# Patient Record
Sex: Male | Born: 1970 | Race: Black or African American | Hispanic: No | Marital: Single
Health system: Southern US, Community
[De-identification: ages and names within clinical notes are randomized; demographics above are authoritative.]

---

## 2003-07-01 ENCOUNTER — Emergency Department (HOSPITAL_COMMUNITY): Admission: EM | Admit: 2003-07-01 | Discharge: 2003-07-02 | Payer: Self-pay | Admitting: Emergency Medicine

## 2010-06-02 ENCOUNTER — Emergency Department (HOSPITAL_COMMUNITY)
Admission: EM | Admit: 2010-06-02 | Discharge: 2010-06-03 | Payer: Self-pay | Source: Home / Self Care | Admitting: Emergency Medicine

## 2010-08-30 LAB — CBC
HCT: 40.5 % (ref 39.0–52.0)
Platelets: 233 10*3/uL (ref 150–400)
RBC: 4.79 MIL/uL (ref 4.22–5.81)
RDW: 12.9 % (ref 11.5–15.5)
WBC: 9.8 10*3/uL (ref 4.0–10.5)

## 2010-08-30 LAB — COMPREHENSIVE METABOLIC PANEL
AST: 21 U/L (ref 0–37)
Albumin: 4.3 g/dL (ref 3.5–5.2)
BUN: 8 mg/dL (ref 6–23)
Creatinine, Ser: 1.13 mg/dL (ref 0.4–1.5)
GFR calc Af Amer: >60 mL/min (ref >60)
Potassium: 4 mEq/L (ref 3.5–5.1)
Total Protein: 7.3 g/dL (ref 6.0–8.3)

## 2010-08-30 LAB — DIFFERENTIAL
Lymphocytes Relative: 16 % (ref 12–46)
Monocytes Absolute: 0.7 10*3/uL (ref 0.1–1.0)
Monocytes Relative: 8 % (ref 3–12)
Neutro Abs: 7.4 10*3/uL (ref 1.7–7.7)

## 2010-08-30 LAB — CK TOTAL AND CKMB (NOT AT ARMC)
CK, MB: 1.1 ng/mL (ref 0.3–4.0)
Relative Index: 0.9 (ref 0.0–2.5)

## 2010-08-30 LAB — BRAIN NATRIURETIC PEPTIDE: Pro B Natriuretic peptide (BNP): <30 pg/mL (ref 0.0–100.0)

## 2018-02-22 ENCOUNTER — Encounter (HOSPITAL_COMMUNITY): Payer: Self-pay | Admitting: Emergency Medicine

## 2018-02-22 ENCOUNTER — Emergency Department (HOSPITAL_COMMUNITY): Payer: BLUE CROSS/BLUE SHIELD

## 2018-02-22 ENCOUNTER — Emergency Department (HOSPITAL_COMMUNITY)
Admission: EM | Admit: 2018-02-22 | Discharge: 2018-02-22 | Disposition: A | Discharge status: Home / Self Care | Payer: BLUE CROSS/BLUE SHIELD | Attending: Emergency Medicine | Admitting: Emergency Medicine

## 2018-02-22 DIAGNOSIS — N281 Cyst of kidney, acquired: Secondary | ICD-10-CM | POA: Insufficient documentation

## 2018-02-22 DIAGNOSIS — R1032 Left lower quadrant pain: Secondary | ICD-10-CM | POA: Diagnosis present

## 2018-02-22 DIAGNOSIS — N200 Calculus of kidney: Secondary | ICD-10-CM | POA: Diagnosis not present

## 2018-02-22 DIAGNOSIS — R109 Unspecified abdominal pain: Secondary | ICD-10-CM

## 2018-02-22 LAB — CBC
HEMATOCRIT: 41.3 % (ref 39.0–52.0)
HEMOGLOBIN: 13.8 g/dL (ref 13.0–17.0)
MCH: 28.8 pg (ref 26.0–34.0)
MCHC: 33.4 g/dL (ref 30.0–36.0)
MCV: 86 fL (ref 78.0–100.0)
Platelets: 221 10*3/uL (ref 150–400)
RBC: 4.8 MIL/uL (ref 4.22–5.81)
RDW: 12.6 % (ref 11.5–15.5)
WBC: 11.3 10*3/uL — ABNORMAL HIGH (ref 4.0–10.5)

## 2018-02-22 LAB — BASIC METABOLIC PANEL
Anion gap: 11 (ref 5–15)
BUN: 13 mg/dL (ref 6–20)
CALCIUM: 9.4 mg/dL (ref 8.9–10.3)
CHLORIDE: 104 mmol/L (ref 98–111)
CO2: 22 mmol/L (ref 22–32)
CREATININE: 1.66 mg/dL — AB (ref 0.61–1.24)
GFR calc Af Amer: 55 mL/min — ABNORMAL LOW (ref >60)
GFR, EST NON AFRICAN AMERICAN: 47 mL/min — AB (ref >60)
Glucose, Bld: 115 mg/dL — ABNORMAL HIGH (ref 70–99)
Potassium: 3.7 mmol/L (ref 3.5–5.1)
Sodium: 137 mmol/L (ref 135–145)

## 2018-02-22 LAB — URINALYSIS, ROUTINE W REFLEX MICROSCOPIC
BILIRUBIN URINE: NEGATIVE
Glucose, UA: NEGATIVE mg/dL
HGB URINE DIPSTICK: NEGATIVE
KETONES UR: 20 mg/dL — AB
Leukocytes, UA: NEGATIVE
Nitrite: NEGATIVE
PH: 5 (ref 5.0–8.0)
Protein, ur: NEGATIVE mg/dL
Specific Gravity, Urine: 1.019 (ref 1.005–1.030)

## 2018-02-22 LAB — LIPASE, BLOOD: Lipase: 28 U/L (ref 11–51)

## 2018-02-22 MED ORDER — KETOROLAC TROMETHAMINE 15 MG/ML IJ SOLN
15.0000 mg | Freq: Once | INTRAMUSCULAR | Status: AC
  Administered 2018-02-22: 15 mg via INTRAVENOUS
  Filled 2018-02-22: qty 1

## 2018-02-22 MED ORDER — NAPROXEN 500 MG PO TABS: 500.0000 mg | ORAL_TABLET | Freq: Two times a day (BID) | ORAL | 0 refills | Status: AC

## 2018-02-22 MED ORDER — TAMSULOSIN HCL 0.4 MG PO CAPS: 0.4000 mg | ORAL_CAPSULE | Freq: Every day | ORAL | 0 refills | Status: AC

## 2018-02-22 MED ORDER — SODIUM CHLORIDE 0.9 % IV BOLUS
1000.0000 mL | Freq: Once | INTRAVENOUS | Status: AC
  Administered 2018-02-22: 1000 mL via INTRAVENOUS

## 2018-02-22 MED ORDER — OXYCODONE-ACETAMINOPHEN 5-325 MG PO TABS: 1.0000 | ORAL_TABLET | ORAL | 0 refills | Status: AC | PRN

## 2018-02-22 MED ORDER — HYDROMORPHONE HCL 1 MG/ML IJ SOLN
1.0000 mg | Freq: Once | INTRAMUSCULAR | Status: AC
  Administered 2018-02-22: 1 mg via INTRAVENOUS
  Filled 2018-02-22: qty 1

## 2018-02-22 NOTE — Discharge Instructions (Addendum)
You were evaluated in the Emergency Department and after careful evaluation, we did not find any emergent condition requiring admission or further testing in the hospital.  Your symptoms today seem to be due to a kidney stone.  Please strain your urine as instructed and follow-up with urology for further care.  Your CT revealed a cyst on the kidney, which may require repeat CT imaging to further evaluate.  He should discuss this finding with your primary care provider or urologist.  Use the Naprosyn and Percocet provided as needed for pain.  Use the Flomax medication once daily to help pass the stone.  Please return to the Emergency Department if you experience any worsening of your condition.  We encourage you to follow up with a primary care provider.  Thank you for allowing [place] to be a part of your care.

## 2018-02-22 NOTE — ED Triage Notes (Signed)
Pt reports since Friday he has had intermittent left sided flank pain. Pt was seen [facility] and was told maybe it was a muscle pain.

## 2018-02-22 NOTE — ED Notes (Signed)
Pt verbalized understanding discharge instructions and denies any further needs or questions at this time. VS stable, ambulatory and steady gait.   

## 2018-02-22 NOTE — ED Notes (Signed)
Patient transported to CT 

## 2018-02-22 NOTE — ED Provider Notes (Signed)
[facility] Emergency Department Provider Note MRN:  [id number]  Arrival date & time: 02/22/18     Chief Complaint   Flank Pain   History of Present Illness   Kyle Nunez is a 47 y.o. year-old male with no pertinent past medical history presenting to the ED with chief complaint of flank pain.  The pain is located in the left flank.  The pain began 3 to 4 days ago, sudden onset, has been intermittent.  The pain is sometimes worse with motion, but at times there at rest.  Moderate in severity, mild improvement with ibuprofen at home.  Was seen at an urgent care yesterday, diagnosed with muscle strain.  He was told that he did have trace blood in his urine yesterday.  Pain is much worse today, more constant.  Denies fever, no chest pain or shortness of breath.  Pain has started to radiate to the left lower quadrant.  No dysuria.  Review of Systems  A complete 10 system review of systems was obtained and all systems are negative except as noted in the HPI and PMH.   Patient's Health History   History reviewed. No pertinent past medical history.  History reviewed. No pertinent surgical history.  No family history on file.  Social History   Socioeconomic History  . Marital status: Single    Spouse name: Not on file  . Number of children: Not on file  . Years of education: Not on file  . Highest education level: Not on file  Occupational History  . Not on file  Social Needs  . Financial resource strain: Not on file  . Food insecurity:    Worry: Not on file    Inability: Not on file  . Transportation needs:    Medical: Not on file    Non-medical: Not on file  Tobacco Use  . Smoking status: Not on file  Substance and Sexual Activity  . Alcohol use: Not on file  . Drug use: Not on file  . Sexual activity: Not on file  Lifestyle  . Physical activity:    Days per week: Not on file    Minutes per session: Not on file  . Stress: Not on file  Relationships  .  Social connections:    Talks on phone: Not on file    Gets together: Not on file    Attends religious service: Not on file    Active member of club or organization: Not on file    Attends meetings of clubs or organizations: Not on file    Relationship status: Not on file  . Intimate partner violence:    Fear of current or ex partner: Not on file    Emotionally abused: Not on file    Physically abused: Not on file    Forced sexual activity: Not on file  Other Topics Concern  . Not on file  Social History Narrative  . Not on file     Physical Exam  Vital Signs and Nursing Notes reviewed Vitals:   02/22/18 1247 02/22/18 1248  BP:    Pulse: 70 77  Resp:    Temp:    SpO2: 98% 98%    CONSTITUTIONAL: Well-appearing, NAD NEURO:  Alert and oriented x 3, no focal deficits EYES:  eyes equal and reactive ENT/NECK:  no LAD, no JVD CARDIO: Regular rate, well-perfused, normal S1 and S2 PULM:  CTAB no wheezing or rhonchi GI/GU:  normal bowel sounds, non-distended, non-tender, mild left  CVA tenderness MSK/SPINE:  No gross deformities, no edema SKIN:  no rash, atraumatic PSYCH:  Appropriate speech and behavior  Diagnostic and Interventional Summary    EKG Interpretation  Date/Time:    Ventricular Rate:    PR Interval:    QRS Duration:   QT Interval:    QTC Calculation:   R Axis:     Text Interpretation:        Labs Reviewed  URINALYSIS, ROUTINE W REFLEX MICROSCOPIC - Abnormal; Notable for the following components:      Result Value   Ketones, ur 20 (*)    All other components within normal limits  BASIC METABOLIC PANEL - Abnormal; Notable for the following components:   Glucose, Bld 115 (*)    Creatinine, Ser 1.66 (*)    GFR calc non Af Amer 47 (*)    GFR calc Af Amer 55 (*)    All other components within normal limits  CBC - Abnormal; Notable for the following components:   WBC 11.3 (*)    All other components within normal limits  LIPASE, BLOOD    CT Renal Stone  Study  Final Result      Medications  sodium chloride 0.9 % bolus 1,000 mL (1,000 mLs Intravenous New Bag/Given 02/22/18 1249)  ketorolac (TORADOL) 15 MG/ML injection 15 mg (15 mg Intravenous Given 02/22/18 1249)  HYDROmorphone (DILAUDID) injection 1 mg (1 mg Intravenous Given 02/22/18 1250)     Procedures Critical Care  ED Course and Medical Decision Making  I have reviewed the triage vital signs and the nursing notes.  Pertinent labs & imaging results that were available during my care of the patient were reviewed by me and considered in my medical decision making (see below for details).  Question of MSK pain versus kidney stone versus diverticulitis in this 47 year old otherwise healthy male.  Labs, CT, pain control, urinalysis, will reassess.  Clinical Course as of Feb 22 1521  Thu Feb 22, 2018  1515 CT confirms 3 mm stone.  Patient's pain is well controlled, was given a liter of fluids here in the ED.  Mild AKI, patient informed of this and advised to drink plenty of fluids at home.  Will be provided with strainer to try to catch the stone, advised to follow-up with urology.  Patient informed of the cystic lesion on the kidney.  Patient has been having no fevers, nothing clinically to suggest that this finding is actually a renal abscess.  White blood cell count 11.  Will provide patient with this CT reading, and advised him to follow-up with PCP or urology for repeat imaging to exclude neoplastic process.  Prescription for Naprosyn, short course Percocet.After the discussed management above, the patient was determined to be safe for discharge.  The patient was in agreement with this plan and all questions regarding their care were answered.  ED return precautions were discussed and the patient will return to the ED with any significant worsening of condition.   [MB]    Clinical Course User Index [MB] Sabas Sous, MD    Patient agrees to return to the ED with any fever.   Elmer Sow. Pilar Plate, MD [facility] Emergency Medicine [facility] [email or web address]  Final Clinical Impressions(s) / ED Diagnoses     ICD-10-CM   1. Kidney stone N20.0   2. 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 Flank pain R10.9     ED Discharge Orders  Ordered    naproxen (NAPROSYN) 500 MG tablet  2 times daily     02/22/18 1518    oxyCODONE-acetaminophen (PERCOCET/ROXICET) 5-325 MG tablet  Every 4 hours PRN     02/22/18 1518    tamsulosin (FLOMAX) 0.4 MG CAPS capsule  Daily     02/22/18 1518             Sabas Sous, MD 02/22/18 [phone]

## 2018-02-22 NOTE — ED Notes (Signed)
Pt given d/c instructions and prescriptions before ambulating to lobby. Pt had no concerns or questions prior to discharge. Strainer given to pt.

## 2019-07-27 IMAGING — CT CT RENAL STONE PROTOCOL
2 of 4 series · 16 of 46 positions shown, 18 images · non-contrast
Comparison: None.

CLINICAL DATA: LEFT flank pain for 5 days, difficulty urinating.

EXAM:
CT ABDOMEN AND PELVIS WITHOUT CONTRAST
TECHNIQUE: Multidetector CT imaging of the abdomen and pelvis was performed
following the standard protocol without IV contrast.

[Series 3: renal stone 5.0 · axial · 0.91mm/px · z∈[+791,+1221]mm · 13 of 94 slices shown, 15 images]
[im 4/94  soft-tissue]
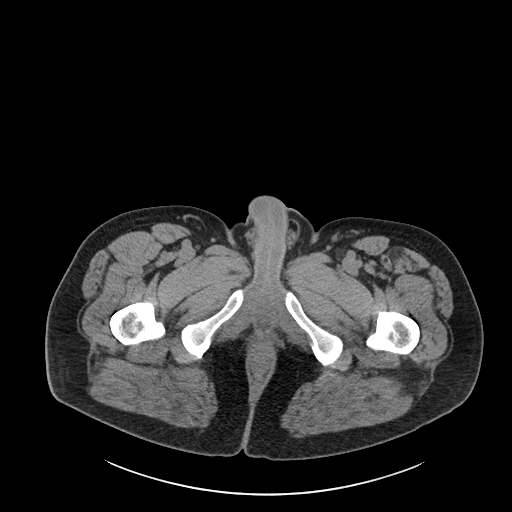
[im 4/94  bone]
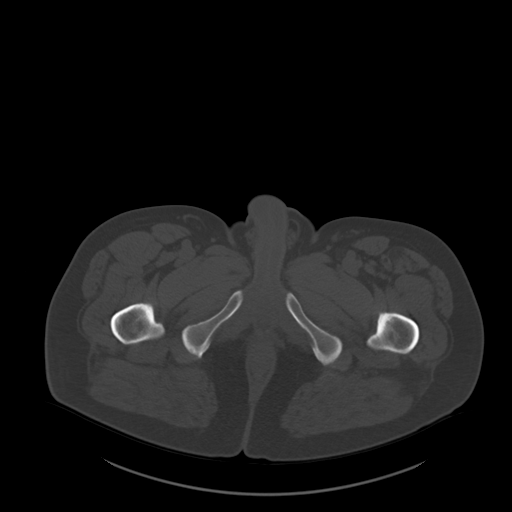
[im 11/94  soft-tissue]
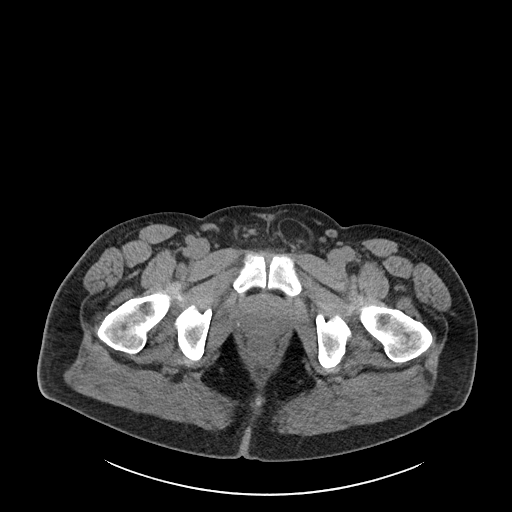
[im 18/94  soft-tissue]
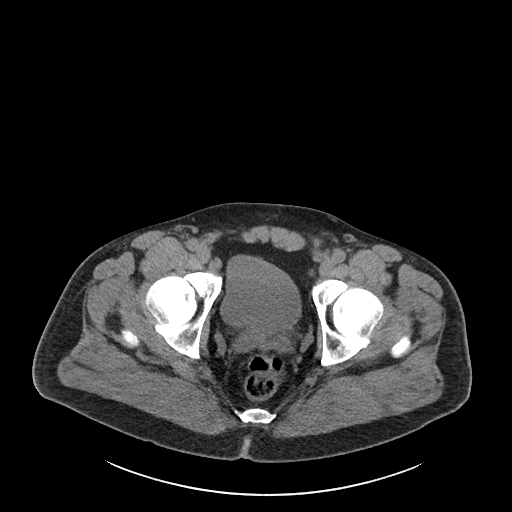
[im 26/94  soft-tissue]
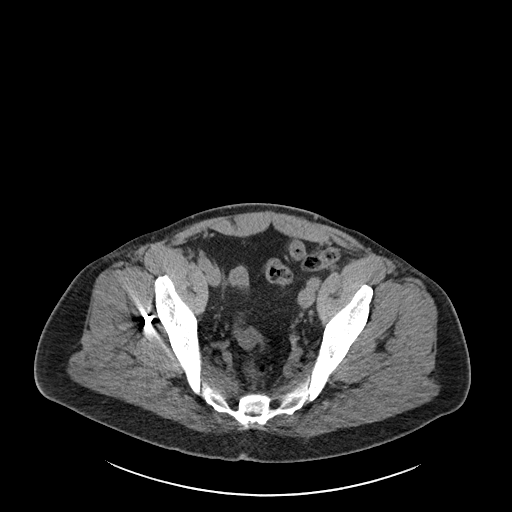
[im 33/94  soft-tissue]
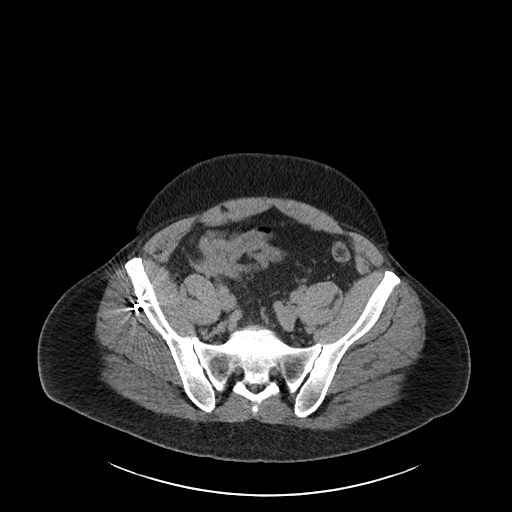
[im 40/94  soft-tissue]
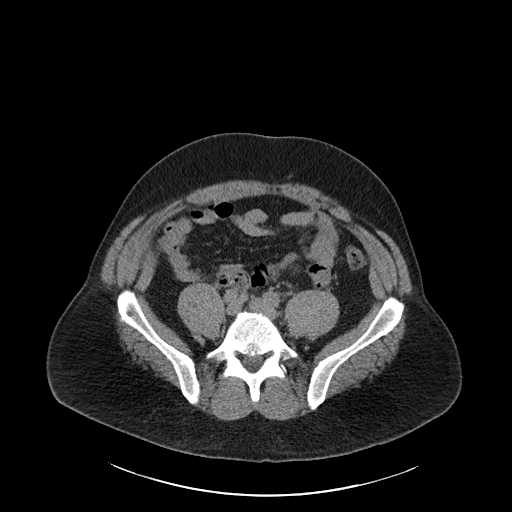
[im 47/94  soft-tissue]
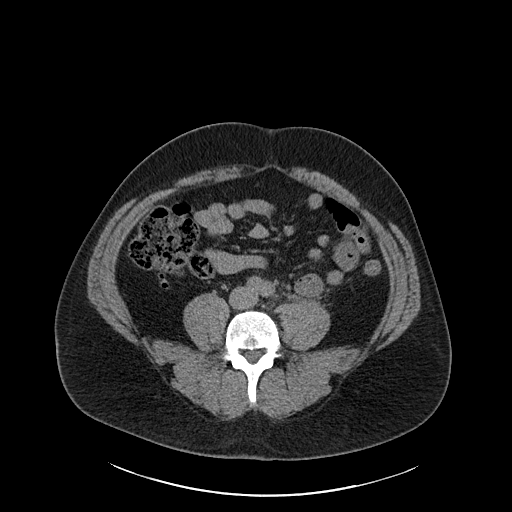
[im 54/94  soft-tissue]
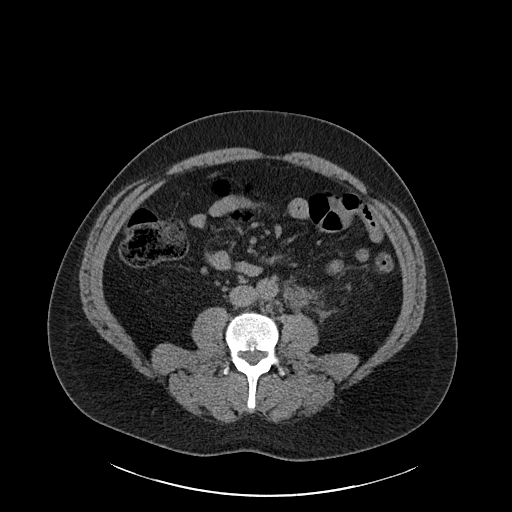
[im 61/94  soft-tissue]
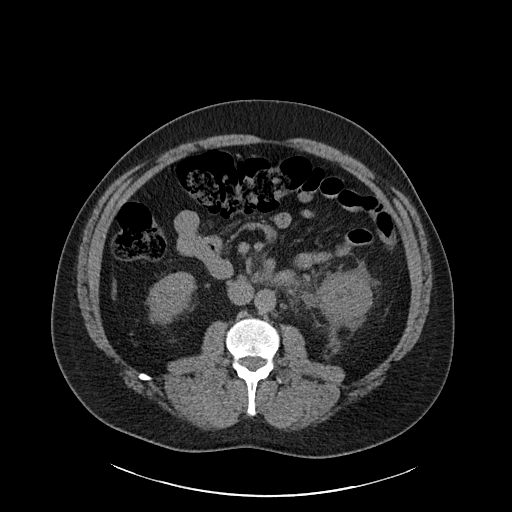
[im 61/94  bone]
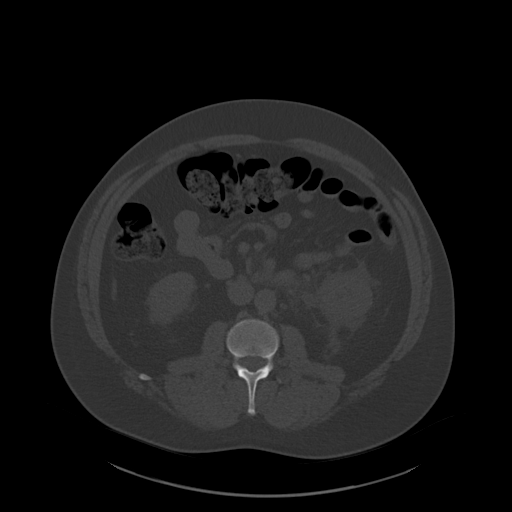
[im 68/94  soft-tissue]
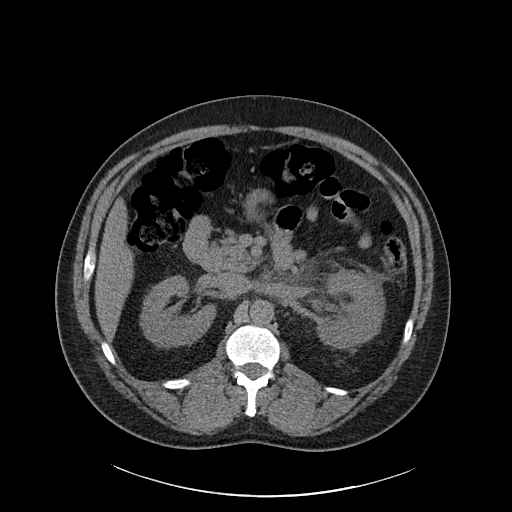
[im 76/94  soft-tissue]
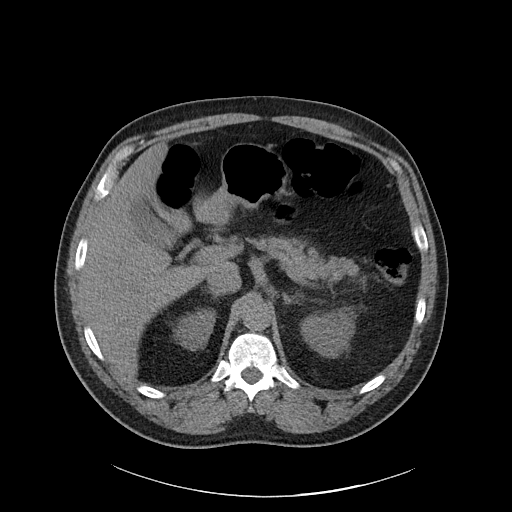
[im 83/94  soft-tissue]
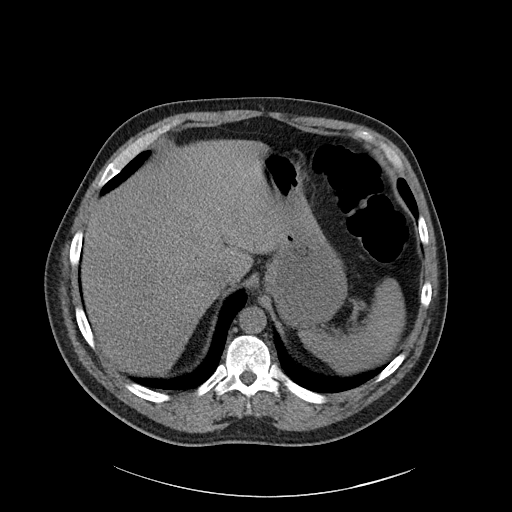
[im 90/94  soft-tissue]
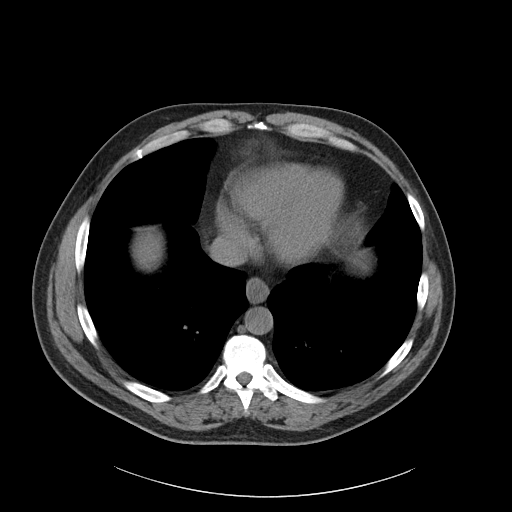

[Series 5: renal stone 3.0 cor · coronal · 0.92mm/px · 3 of 119 slices shown]
[im 40/119  soft-tissue]
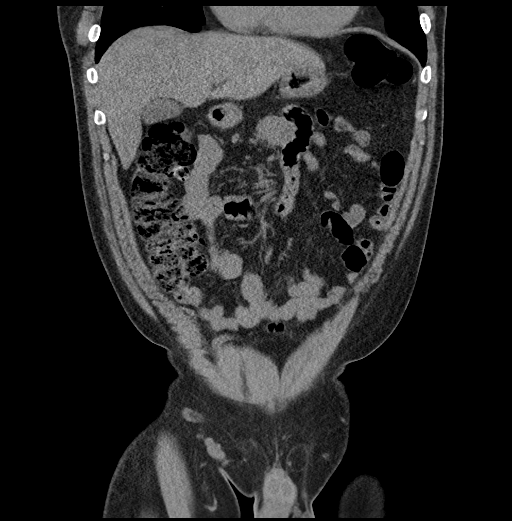
[im 53/119  soft-tissue]
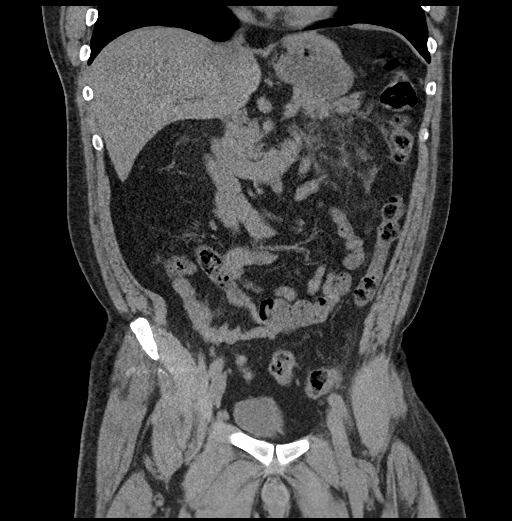
[im 66/119  soft-tissue]
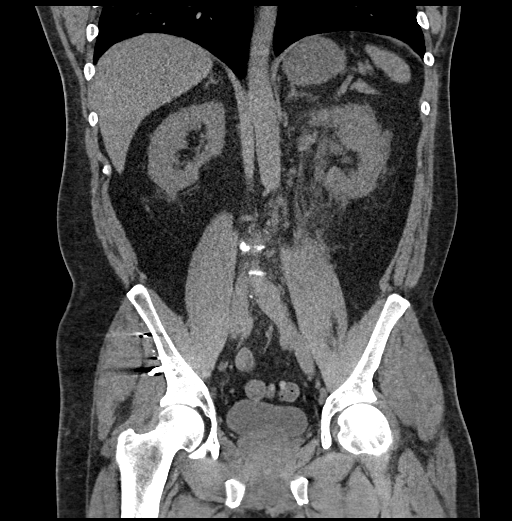

[16 of 46 positions shown; findings below may reference images not displayed]

FINDINGS: Lower chest: No acute abnormality.

Hepatobiliary: No focal liver abnormality is seen. No gallstones,
gallbladder wall thickening, or biliary dilatation.

Pancreas: Unremarkable. No pancreatic ductal dilatation or
surrounding inflammatory changes.

Spleen: Normal in size without focal abnormality.

Adrenals/Urinary Tract: 3 mm stone within the proximal LEFT ureter
causing moderate LEFT-sided hydronephrosis. There is extensive
periureteral and perinephric inflammation/fluid stranding. Hypodense
masslike lesion within the anterior cortex of the LEFT kidney
measures 2.4 cm, cyst versus abscess.

Stomach/Bowel: No dilated large or small bowel loops. No bowel wall
thickening or evidence of bowel wall inflammation. Appendix appears
normal. Stomach is unremarkable, partially decompressed.

Vascular/Lymphatic: No significant vascular findings are present. No
enlarged abdominal or pelvic lymph nodes.

Reproductive: Prostate is unremarkable.

Other: No free intraperitoneal air.

Musculoskeletal: No acute or suspicious osseous finding.
IMPRESSION: 1. Obstructing 3 mm stone within the proximal LEFT ureter causing
moderate LEFT-sided hydronephrosis. There is also an associated
prominent periureteral and perinephric inflammation/fluid stranding.
2. Ill-defined hypodense masslike lesion within the anterior cortex
of the LEFT kidney, measuring 2.4 cm, difficult to definitively
characterize on this noncontrast exam, with differential that
includes renal cortex abscess, benign cyst and neoplastic mass. If
fever or other constitutional symptoms, would consider repeat CT
abdomen with contrast now to exclude renal abscess. If no fever or
other clinical signs suggesting abscess, would merely recommend
follow-up renal protocol CT after current issues are resolved to
ensure benignity (to exclude neoplastic mass).
